# Patient Record
Sex: Female | Born: 1995 | Race: White | Hispanic: No | Marital: Single | Smoking: Former smoker
Health system: Southern US, Community
[De-identification: ages and names within clinical notes are randomized; demographics above are authoritative.]

## PROBLEM LIST (undated history)

## (undated) DIAGNOSIS — F419 Anxiety disorder, unspecified: Secondary | ICD-10-CM

## (undated) DIAGNOSIS — F32A Depression, unspecified: Secondary | ICD-10-CM

---
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
## 2016-04-21 ENCOUNTER — Other Ambulatory Visit: Payer: Self-pay | Admitting: Family Medicine

## 2016-04-21 DIAGNOSIS — R1013 Epigastric pain: Secondary | ICD-10-CM

## 2016-04-22 ENCOUNTER — Ambulatory Visit
Admission: RE | Admit: 2016-04-22 | Discharge: 2016-04-22 | Disposition: A | Discharge status: Home / Self Care | Payer: Managed Care, Other (non HMO) | Source: Ambulatory Visit | Attending: Family Medicine | Admitting: Family Medicine

## 2016-04-22 DIAGNOSIS — R195 Other fecal abnormalities: Secondary | ICD-10-CM | POA: Diagnosis not present

## 2016-04-22 DIAGNOSIS — R1013 Epigastric pain: Secondary | ICD-10-CM | POA: Diagnosis present

## 2016-04-22 MED ORDER — IOPAMIDOL (ISOVUE-300) INJECTION 61%
100.0000 mL | Freq: Once | INTRAVENOUS | Status: AC | PRN
  Administered 2016-04-22: 100 mL via INTRAVENOUS

## 2017-12-03 ENCOUNTER — Ambulatory Visit: Payer: Self-pay | Admitting: Family Medicine

## 2017-12-03 VITALS — BP 100/65 | HR 78 | Temp 97.9°F | Resp 16 | Wt 109.0 lb

## 2017-12-03 DIAGNOSIS — L259 Unspecified contact dermatitis, unspecified cause: Secondary | ICD-10-CM

## 2017-12-03 MED ORDER — TRIAMCINOLONE ACETONIDE 0.1 % EX CREA: 1.0000 "application " | TOPICAL_CREAM | Freq: Two times a day (BID) | CUTANEOUS | 0 refills | Status: AC

## 2017-12-03 MED ORDER — PREDNISONE 20 MG PO TABS: 40.0000 mg | ORAL_TABLET | Freq: Every day | ORAL | 0 refills | Status: AC

## 2017-12-03 NOTE — Progress Notes (Signed)
Heather Adkins is a 22 y.o. female who presents today with concerns of rash that has persisted for the last 4 days. Patient reports recently moving boxes into a new apartment and having those boxes touch  Review of Systems  Constitutional: Negative for chills, fever and malaise/fatigue.  HENT: Negative for congestion, ear discharge, ear pain, sinus pain and sore throat.   Eyes: Negative.   Respiratory: Negative for cough, sputum production and shortness of breath.   Cardiovascular: Negative.  Negative for chest pain.  Gastrointestinal: Negative for abdominal pain, diarrhea, nausea and vomiting.  Genitourinary: Negative for dysuria, frequency, hematuria and urgency.  Musculoskeletal: Negative for myalgias.  Skin: Negative.   Neurological: Negative for headaches.  Endo/Heme/Allergies: Negative.   Psychiatric/Behavioral: Negative.     O: Vitals:   12/03/17 1538  BP: 100/65  Pulse: 78  Resp: 16  Temp: 97.9 F (36.6 C)  SpO2: 99%     Physical Exam  Constitutional: Heather Adkins is oriented to person, place, and time. Vital signs are normal. Heather Adkins appears well-developed and well-nourished. Heather Adkins is active.  Non-toxic appearance. Heather Adkins does not have a sickly appearance.  HENT:  Head: Normocephalic.  Right Ear: Hearing, tympanic membrane, external ear and ear canal normal.  Left Ear: Hearing, tympanic membrane, external ear and ear canal normal.  Nose: Nose normal.  Mouth/Throat: Uvula is midline and oropharynx is clear and moist.  Neck: Normal range of motion. Neck supple.  Cardiovascular: Normal rate, regular rhythm, normal heart sounds and normal pulses.  Pulmonary/Chest: Effort normal and breath sounds normal.  Abdominal: Soft. Bowel sounds are normal.  Musculoskeletal: Normal range of motion.  Lymphadenopathy:       Head (right side): No submental and no submandibular adenopathy present.       Head (left side): No submental and no submandibular adenopathy present.    Heather Adkins has no  cervical adenopathy.  Neurological: Heather Adkins is alert and oriented to person, place, and time.  Skin: Rash noted. Rash is maculopapular. There is erythema.     Psychiatric: Heather Adkins has a normal mood and affect.  Vitals reviewed.    A: 1. Contact dermatitis, unspecified contact dermatitis type, unspecified trigger      P: Discussed exam findings, diagnosis etiology and medication use and indications reviewed with patient. Follow- Up and discharge instructions provided. No emergent/urgent issues found on exam.  Patient verbalized understanding of information provided and agrees with plan of care (POC), all questions answered.  1. Contact dermatitis, unspecified contact dermatitis type, unspecified trigger - predniSONE (DELTASONE) 20 MG tablet; Take 2 tablets (40 mg total) by mouth daily with breakfast for 5 days. - triamcinolone cream (KENALOG) 0.1 %; Apply 1 application topically 2 (two) times daily.

## 2017-12-03 NOTE — Patient Instructions (Signed)
Contact Dermatitis Dermatitis is redness, soreness, and swelling (inflammation) of the skin. Contact dermatitis is a reaction to certain substances that touch the skin. You either touched something that irritated your skin, or you have allergies to something you touched. Follow these instructions at home: Skin Care  Moisturize your skin as needed.  Apply cool compresses to the affected areas.  Try taking a bath with: ? Epsom salts. Follow the instructions on the package. You can get these at a pharmacy or grocery store. ? Baking soda. Pour a small amount into the bath as told by your doctor. ? Colloidal oatmeal. Follow the instructions on the package. You can get this at a pharmacy or grocery store.  Try applying baking soda paste to your skin. Stir water into baking soda until it looks like paste.  Do not scratch your skin.  Bathe less often.  Bathe in lukewarm water. Avoid using hot water. Medicines  Take or apply over-the-counter and prescription medicines only as told by your doctor.  If you were prescribed an antibiotic medicine, take or apply your antibiotic as told by your doctor. Do not stop taking the antibiotic even if your condition starts to get better. General instructions  Keep all follow-up visits as told by your doctor. This is important.  Avoid the substance that caused your reaction. If you do not know what caused it, keep a journal to try to track what caused it. Write down: ? What you eat. ? What cosmetic products you use. ? What you drink. ? What you wear in the affected area. This includes jewelry.  If you were given a bandage (dressing), take care of it as told by your doctor. This includes when to change and remove it. Contact a doctor if:  You do not get better with treatment.  Your condition gets worse.  You have signs of infection such as: ? Swelling. ? Tenderness. ? Redness. ? Soreness. ? Warmth.  You have a fever.  You have new  symptoms. Get help right away if:  You have a very bad headache.  You have neck pain.  Your neck is stiff.  You throw up (vomit).  You feel very sleepy.  You see red streaks coming from the affected area.  Your bone or joint underneath the affected area becomes painful after the skin has healed.  The affected area turns darker.  You have trouble breathing. This information is not intended to replace advice given to you by your health care provider. Make sure you discuss any questions you have with your health care provider. Document Released: 02/06/2009 Document Revised: 09/17/2015 Document Reviewed: 08/27/2014 Elsevier Interactive Patient Education  2018 [employer]  

## 2017-12-05 ENCOUNTER — Telehealth: Payer: Self-pay

## 2017-12-05 NOTE — Telephone Encounter (Signed)
I left a message asking the patient asking to call us back if she has any questions or concerns. 

## 2019-10-22 ENCOUNTER — Encounter: Payer: Self-pay | Admitting: Intensive Care

## 2019-10-22 ENCOUNTER — Other Ambulatory Visit: Payer: Self-pay

## 2019-10-22 ENCOUNTER — Emergency Department: Payer: Managed Care, Other (non HMO)

## 2019-10-22 ENCOUNTER — Emergency Department
Admission: EM | Admit: 2019-10-22 | Discharge: 2019-10-22 | Disposition: A | Discharge status: Home / Self Care | Payer: Managed Care, Other (non HMO) | Attending: Emergency Medicine | Admitting: Emergency Medicine

## 2019-10-22 DIAGNOSIS — R112 Nausea with vomiting, unspecified: Secondary | ICD-10-CM | POA: Diagnosis not present

## 2019-10-22 DIAGNOSIS — R197 Diarrhea, unspecified: Secondary | ICD-10-CM | POA: Insufficient documentation

## 2019-10-22 DIAGNOSIS — Z3202 Encounter for pregnancy test, result negative: Secondary | ICD-10-CM | POA: Diagnosis not present

## 2019-10-22 DIAGNOSIS — R1012 Left upper quadrant pain: Secondary | ICD-10-CM | POA: Diagnosis present

## 2019-10-22 DIAGNOSIS — F17210 Nicotine dependence, cigarettes, uncomplicated: Secondary | ICD-10-CM | POA: Diagnosis not present

## 2019-10-22 DIAGNOSIS — E860 Dehydration: Secondary | ICD-10-CM

## 2019-10-22 HISTORY — DX: Depression, unspecified: F32.A

## 2019-10-22 HISTORY — DX: Anxiety disorder, unspecified: F41.9

## 2019-10-22 LAB — URINALYSIS, COMPLETE (UACMP) WITH MICROSCOPIC
Bacteria, UA: NONE SEEN
Bilirubin Urine: NEGATIVE
Glucose, UA: NEGATIVE mg/dL
Ketones, ur: 80 mg/dL — AB
Leukocytes,Ua: NEGATIVE
Nitrite: NEGATIVE
Protein, ur: >=300 mg/dL — AB
RBC / HPF: >50 RBC/hpf — ABNORMAL HIGH (ref 0–5)
Specific Gravity, Urine: 1.024 (ref 1.005–1.030)
pH: 9 — ABNORMAL HIGH (ref 5.0–8.0)

## 2019-10-22 LAB — COMPREHENSIVE METABOLIC PANEL
ALT: 28 U/L (ref 0–44)
AST: 37 U/L (ref 15–41)
Albumin: 4.8 g/dL (ref 3.5–5.0)
Alkaline Phosphatase: 106 U/L (ref 38–126)
Anion gap: 17 — ABNORMAL HIGH (ref 5–15)
BUN: 12 mg/dL (ref 6–20)
CO2: 18 mmol/L — ABNORMAL LOW (ref 22–32)
Calcium: 9.6 mg/dL (ref 8.9–10.3)
Chloride: 104 mmol/L (ref 98–111)
Creatinine, Ser: 0.77 mg/dL (ref 0.44–1.00)
GFR calc Af Amer: >60 mL/min (ref >60)
GFR calc non Af Amer: >60 mL/min (ref >60)
Glucose, Bld: 123 mg/dL — ABNORMAL HIGH (ref 70–99)
Potassium: 3.1 mmol/L — ABNORMAL LOW (ref 3.5–5.1)
Sodium: 139 mmol/L (ref 135–145)
Total Bilirubin: 1.8 mg/dL — ABNORMAL HIGH (ref 0.3–1.2)
Total Protein: 7.9 g/dL (ref 6.5–8.1)

## 2019-10-22 LAB — CBC
HCT: 40.4 % (ref 36.0–46.0)
Hemoglobin: 14.5 g/dL (ref 12.0–15.0)
MCH: 34.4 pg — ABNORMAL HIGH (ref 26.0–34.0)
MCHC: 35.9 g/dL (ref 30.0–36.0)
MCV: 95.7 fL (ref 80.0–100.0)
Platelets: 315 10*3/uL (ref 150–400)
RBC: 4.22 MIL/uL (ref 3.87–5.11)
RDW: 11.6 % (ref 11.5–15.5)
WBC: 17.7 10*3/uL — ABNORMAL HIGH (ref 4.0–10.5)
nRBC: 0 % (ref 0.0–0.2)

## 2019-10-22 LAB — GLUCOSE, CAPILLARY: Glucose-Capillary: 119 mg/dL — ABNORMAL HIGH (ref 70–99)

## 2019-10-22 LAB — POCT PREGNANCY, URINE: Preg Test, Ur: NEGATIVE

## 2019-10-22 LAB — LIPASE, BLOOD: Lipase: 20 U/L (ref 11–51)

## 2019-10-22 MED ORDER — ONDANSETRON HCL 4 MG/2ML IJ SOLN
4.0000 mg | Freq: Once | INTRAMUSCULAR | Status: AC | PRN
  Administered 2019-10-22: 4 mg via INTRAVENOUS
  Filled 2019-10-22: qty 2

## 2019-10-22 MED ORDER — SODIUM CHLORIDE 0.9 % IV BOLUS
1000.0000 mL | Freq: Once | INTRAVENOUS | Status: AC
  Administered 2019-10-22: 1000 mL via INTRAVENOUS

## 2019-10-22 MED ORDER — LIDOCAINE VISCOUS HCL 2 % MT SOLN
15.0000 mL | Freq: Once | OROMUCOSAL | Status: AC
  Administered 2019-10-22: 15 mL via ORAL
  Filled 2019-10-22: qty 15

## 2019-10-22 MED ORDER — PROMETHAZINE HCL 25 MG RE SUPP: 25.0000 mg | Freq: Three times a day (TID) | RECTAL | 0 refills | Status: AC | PRN

## 2019-10-22 MED ORDER — ONDANSETRON 4 MG PO TBDP
4.0000 mg | ORAL_TABLET | Freq: Three times a day (TID) | ORAL | 0 refills | Status: AC | PRN
Start: 2019-10-22 — End: ?

## 2019-10-22 MED ORDER — MORPHINE SULFATE (PF) 4 MG/ML IV SOLN
4.0000 mg | Freq: Once | INTRAVENOUS | Status: AC
  Administered 2019-10-22: 4 mg via INTRAVENOUS
  Filled 2019-10-22: qty 1

## 2019-10-22 MED ORDER — FENTANYL CITRATE (PF) 100 MCG/2ML IJ SOLN
50.0000 ug | INTRAMUSCULAR | Status: DC | PRN
  Administered 2019-10-22: 50 ug via INTRAVENOUS
  Filled 2019-10-22: qty 2

## 2019-10-22 MED ORDER — ALUM & MAG HYDROXIDE-SIMETH 200-200-20 MG/5ML PO SUSP
30.0000 mL | Freq: Once | ORAL | Status: AC
  Administered 2019-10-22: 30 mL via ORAL
  Filled 2019-10-22: qty 30

## 2019-10-22 MED ORDER — FAMOTIDINE 20 MG PO TABS
40.0000 mg | ORAL_TABLET | Freq: Every day | ORAL | 0 refills | Status: AC
Start: 2019-10-22 — End: 2019-10-27

## 2019-10-22 MED ORDER — ONDANSETRON HCL 4 MG/2ML IJ SOLN
INTRAMUSCULAR | Status: AC
  Filled 2019-10-22: qty 2

## 2019-10-22 MED ORDER — DICYCLOMINE HCL 10 MG PO CAPS
20.0000 mg | ORAL_CAPSULE | Freq: Once | ORAL | Status: AC
  Administered 2019-10-22: 20 mg via ORAL
  Filled 2019-10-22: qty 2

## 2019-10-22 MED ORDER — ONDANSETRON HCL 4 MG/2ML IJ SOLN
4.0000 mg | Freq: Once | INTRAMUSCULAR | Status: AC | PRN
  Administered 2019-10-22: 4 mg via INTRAVENOUS

## 2019-10-22 MED ORDER — IOHEXOL 300 MG/ML  SOLN
75.0000 mL | Freq: Once | INTRAMUSCULAR | Status: AC | PRN
  Administered 2019-10-22: 75 mL via INTRAVENOUS
  Filled 2019-10-22: qty 75

## 2019-10-22 MED ORDER — KETOROLAC TROMETHAMINE 30 MG/ML IJ SOLN
15.0000 mg | Freq: Once | INTRAMUSCULAR | Status: AC
  Administered 2019-10-22: 15 mg via INTRAVENOUS
  Filled 2019-10-22: qty 1

## 2019-10-22 NOTE — ED Notes (Signed)
Patient transported to CT 

## 2019-10-22 NOTE — ED Triage Notes (Signed)
Pt c/o LLQ pain with N/V/D since yesterday. Denies fevers. Reports drinking alcohol last night. Patient breathing heavily in triage.

## 2019-10-22 NOTE — ED Provider Notes (Signed)
[facility] Emergency Department Provider Note  ____________________________________________   First MD Initiated Contact with Patient 10/22/19 1500     (approximate)  I have reviewed the triage vital signs and the nursing notes.   HISTORY  Chief Complaint Abdominal Pain    HPI Heather Adkins is a 24 y.o. female  With h/o depression, anxiety, here with left sided abdominal pain. Pt reports that her sx started yesterday. She had gone out the night before and drank a decent amount of tequila, which is new for her. The next AM, she began vomiting and vomited for the next day. She has been essentially unable to eat/drink much since then. Her vomiting improved yesterday but she has since developed severe, aching, gnawing, left-sided abd pain along with vomiting and difficulty PO. No fever, chills. No urinary symptoms. Denies vaginal bleeding, discharge. No known h/o gallstones, pancreatitis, or gastritis. No heavy regular heavy EtOH use. No blood in her emesis or diarrhea or stool.        Past Medical History:  Diagnosis Date  . Anxiety   . Depression     There are no problems to display for this patient.   History reviewed. No pertinent surgical history.  Prior to Admission medications   Medication Sig Start Date End Date Taking? Authorizing Provider  cholecalciferol (VITAMIN D) 1000 units tablet Take 1,000 Units by mouth daily.    [provider]  famotidine (PEPCID) 20 MG tablet Take 2 tablets (40 mg total) by mouth daily for 5 days. 10/22/19 10/27/19  Shaune Pollack, MD  HYDROCORTISONE-DIPHENHYDRAMINE EX Apply topically.    [provider]  medroxyPROGESTERone (DEPO-PROVERA) 150 MG/ML injection Inject into the muscle. 12/23/16   [provider]  medroxyPROGESTERone Acetate 150 MG/ML SUSY  09/12/17   [provider]  Multiple Vitamin (MULTIVITAMIN) tablet Take 1 tablet by mouth daily.    [provider]    ondansetron (ZOFRAN ODT) 4 MG disintegrating tablet Take 1 tablet (4 mg total) by mouth every 8 (eight) hours as needed for nausea or vomiting. 10/22/19   Shaune Pollack, MD  promethazine (PHENERGAN) 25 MG suppository Place 1 suppository (25 mg total) rectally every 8 (eight) hours as needed for refractory nausea / vomiting. 10/22/19 10/21/20  Shaune Pollack, MD  triamcinolone cream (KENALOG) 0.1 % Apply 1 application topically 2 (two) times daily. 12/03/17   Zachery Dauer, NP  venlafaxine XR (EFFEXOR-XR) 150 MG 24 hr capsule Take by mouth. 12/22/16   [provider]    Allergies Patient has no known allergies.  History reviewed. No pertinent family history.  Social History Social History   Tobacco Use  . Smoking status: Current Every Day Smoker    Types: E-cigarettes  . Smokeless tobacco: Never Used  Substance Use Topics  . Alcohol use: Yes  . Drug use: Yes    Types: Marijuana    Review of Systems  Review of Systems  Constitutional: Positive for fatigue. Negative for fever.  HENT: Negative for congestion and sore throat.   Eyes: Negative for visual disturbance.  Respiratory: Negative for cough and shortness of breath.   Cardiovascular: Negative for chest pain.  Gastrointestinal: Positive for abdominal pain, nausea and vomiting. Negative for diarrhea.  Genitourinary: Negative for flank pain.  Musculoskeletal: Negative for back pain and neck pain.  Skin: Negative for rash and wound.  Neurological: Negative for weakness.  All other systems reviewed and are negative.    ____________________________________________  PHYSICAL EXAM:  VITAL SIGNS: ED Triage Vitals  Enc Vitals Group     BP 10/22/19 1337 (!) 137/91     Pulse Rate 10/22/19 1337 (!) 102     Resp 10/22/19 1337 (!) 28     Temp 10/22/19 1337 98.3 F (36.8 C)     Temp Source 10/22/19 1337 Oral     SpO2 10/22/19 1337 100 %     Weight 10/22/19 1342 118 lb (53.5 kg)     Height 10/22/19 1342 5\' 5"   (1.651 m)     Head Circumference --      Peak Flow --      Pain Score 10/22/19 1342 10     Pain Loc --      Pain Edu? --      Excl. in GC? --      Physical Exam Vitals and nursing note reviewed.  Constitutional:      General: She is not in acute distress.    Appearance: She is well-developed.  HENT:     Head: Normocephalic and atraumatic.  Eyes:     Conjunctiva/sclera: Conjunctivae normal.  Cardiovascular:     Rate and Rhythm: Normal rate and regular rhythm.     Heart sounds: Normal heart sounds. No murmur heard.  No friction rub.  Pulmonary:     Effort: Pulmonary effort is normal. No respiratory distress.     Breath sounds: Normal breath sounds. No wheezing or rales.  Abdominal:     General: There is no distension.     Palpations: Abdomen is soft.     Tenderness: There is no abdominal tenderness.  Musculoskeletal:     Cervical back: Neck supple.  Skin:    General: Skin is warm.     Capillary Refill: Capillary refill takes less than 2 seconds.  Neurological:     Mental Status: She is alert and oriented to person, place, and time.     Motor: No abnormal muscle tone.       ____________________________________________   LABS (all labs ordered are listed, but only abnormal results are displayed)  Labs Reviewed  GLUCOSE, CAPILLARY - Abnormal; Notable for the following components:      Result Value   Glucose-Capillary 119 (*)    All other components within normal limits  COMPREHENSIVE METABOLIC PANEL - Abnormal; Notable for the following components:   Potassium 3.1 (*)    CO2 18 (*)    Glucose, Bld 123 (*)    Total Bilirubin 1.8 (*)    Anion gap 17 (*)    All other components within normal limits  CBC - Abnormal; Notable for the following components:   WBC 17.7 (*)    MCH 34.4 (*)    All other components within normal limits  URINALYSIS, COMPLETE (UACMP) WITH MICROSCOPIC - Abnormal; Notable for the following components:   Color, Urine YELLOW (*)     APPearance HAZY (*)    pH 9.0 (*)    Hgb urine dipstick SMALL (*)    Ketones, ur 80 (*)    Protein, ur >=300 (*)    RBC / HPF >50 (*)    All other components within normal limits  LIPASE, BLOOD  POC URINE PREG, ED  POCT PREGNANCY, URINE    ____________________________________________  EKG: None ________________________________________  RADIOLOGY All imaging, including plain films, CT scans, and ultrasounds, independently reviewed by me, and interpretations confirmed via formal radiology reads.  ED MD interpretation:   CT: Fluid throughout small bowel, no acute abnormality, normal appendix,  no hydro  Official radiology report(s): CT ABDOMEN PELVIS W CONTRAST  Result Date: 10/22/2019 CLINICAL DATA:  Abdominal pain with nausea vomiting EXAM: CT ABDOMEN AND PELVIS WITH CONTRAST TECHNIQUE: Multidetector CT imaging of the abdomen and pelvis was performed using the standard protocol following bolus administration of intravenous contrast. CONTRAST:  60mL OMNIPAQUE IOHEXOL 300 MG/ML  SOLN COMPARISON:  April 22, 2016 FINDINGS: Lower chest: Lung bases are clear. Hepatobiliary: No focal liver lesions are appreciable. Gallbladder wall is not appreciably thickened. There is no biliary duct dilatation. Pancreas: There is no pancreatic mass or inflammatory focus. Spleen: No splenic lesions are evident. Adrenals/Urinary Tract: Adrenals bilaterally appear unremarkable. There is no appreciable renal mass or hydronephrosis on either side. No intrarenal calculi are evident. Contrast present with in the collecting systems could mask small intrarenal calculi. No ureteral calculi are evident on either side. Urinary bladder is midline with wall thickness within normal limits. Stomach/Bowel: Most small bowel loops are fluid filled. There is no appreciable bowel wall or mesenteric thickening. No evident bowel obstruction. The terminal ileum appears normal. There is no appreciable free air or portal venous air. No  appreciable diverticular disease. Vascular/Lymphatic: No abdominal aortic aneurysm. No arterial vascular lesions evident. Major venous structures appear patent. There is no evident adenopathy in the abdomen or pelvis. Reproductive: Uterus is anteverted. There is an apparent dominant follicle in the left ovary measuring 2.1 x 1.9 cm. Beyond apparent dominant follicle, no pelvic mass lesions are evident. Other: Appendix appears unremarkable. No abscess or ascites is evident in the abdomen or pelvis. Musculoskeletal: No blastic or lytic bone lesions. There is no intramuscular or abdominal wall lesion evident. IMPRESSION: 1. Most small bowel loops are fluid filled. While this finding may be seen normally, it raises question of a degree of enteritis or early ileus. Bowel obstruction not present. No bowel wall thickening. 2. No abscess in the abdomen pelvis. Appendix region appears normal. 3. No hydronephrosis. No ureteral calculus evident. No renal calculus evident with contrast noted in portions of the collecting systems which potentially could mask small calculi within the kidneys. Electronically Signed   By: Bretta Bang III M.D.   On: 10/22/2019 16:33    ____________________________________________  PROCEDURES   Procedure(s) performed (including Critical Care):  Procedures  ____________________________________________  INITIAL IMPRESSION / MDM / ASSESSMENT AND PLAN / ED COURSE  As part of my medical decision making, I reviewed the following data within the electronic MEDICAL RECORD NUMBER Nursing notes reviewed and incorporated, Old chart reviewed, Notes from prior ED visits, and [place] Controlled Substance Database       *Shermika Balthaser was evaluated in Emergency Department on 10/22/2019 for the symptoms described in the history of present illness. She was evaluated in the context of the global COVID-19 pandemic, which necessitated consideration that the patient might be at risk for infection  with the SARS-CoV-2 virus that causes COVID-19. Institutional protocols and algorithms that pertain to the evaluation of patients at risk for COVID-19 are in a state of rapid change based on information released by regulatory bodies including the CDC and federal and state organizations. These policies and algorithms were followed during the patient's care in the ED.  Some ED evaluations and interventions may be delayed as a result of limited staffing during the pandemic.*     Medical Decision Making:  24 yo F here with abd pain, n/v in setting of heavy EtOH use. Likely alcohol gastritis, less likely viral GI illness. Pt appears dehydrated on arrival  and labs show decreased bicarb, mild hypoK, ketonuria c/w dehydration. She has mild pyuria but no bacteria, no urinary sx, doubt UTI. She is on her period hence bleeding. WBC 17.7k likely reactive, and AG 17 likely related to her dehydration. Glucose is 123, CO2 18. No glucosuria.  Following fluids, analgesia, pt feels much better. She is tolerating PO. Will d/c with supportive care, encouraged fluids, and close f/u. Return precautions given.  ____________________________________________  FINAL CLINICAL IMPRESSION(S) / ED DIAGNOSES  Final diagnoses:  Dehydration  Nausea vomiting and diarrhea     MEDICATIONS GIVEN DURING THIS VISIT:  Medications  ondansetron (ZOFRAN) injection 4 mg (4 mg Intravenous Given 10/22/19 1348)  sodium chloride 0.9 % bolus 1,000 mL (0 mLs Intravenous Stopped 10/22/19 1715)  sodium chloride 0.9 % bolus 1,000 mL (0 mLs Intravenous Stopped 10/22/19 1715)  ketorolac (TORADOL) 30 MG/ML injection 15 mg (15 mg Intravenous Given 10/22/19 1548)  morphine 4 MG/ML injection 4 mg (4 mg Intravenous Given 10/22/19 1547)  ondansetron (ZOFRAN) injection 4 mg (4 mg Intravenous Given 10/22/19 1554)  iohexol (OMNIPAQUE) 300 MG/ML solution 75 mL (75 mLs Intravenous Contrast Given 10/22/19 1612)  dicyclomine (BENTYL) capsule 20 mg (20 mg Oral  Given 10/22/19 1647)  alum & mag hydroxide-simeth (MAALOX/MYLANTA) 200-200-20 MG/5ML suspension 30 mL (30 mLs Oral Given 10/22/19 1647)    And  lidocaine (XYLOCAINE) 2 % viscous mouth solution 15 mL (15 mLs Oral Given 10/22/19 1647)     ED Discharge Orders         Ordered    ondansetron (ZOFRAN ODT) 4 MG disintegrating tablet  Every 8 hours PRN     Discontinue  Reprint     10/22/19 1731    promethazine (PHENERGAN) 25 MG suppository  Every 8 hours PRN     Discontinue  Reprint     10/22/19 1731    famotidine (PEPCID) 20 MG tablet  Daily     Discontinue  Reprint     10/22/19 1731           Note:  This document was prepared using Dragon voice recognition software and may include unintentional dictation errors.   Shaune PollackIsaacs, Nadyne Gariepy, MD 10/22/19 1758

## 2021-01-01 IMAGING — CT CT ABD-PELV W/ CM
2 of 4 series · 15 of 46 positions shown, 17 images · IV contrast (APPLIED)
Comparison: April 22, 2016

CLINICAL DATA: Abdominal pain with nausea vomiting

EXAM:
CT ABDOMEN AND PELVIS WITH CONTRAST
TECHNIQUE: Multidetector CT imaging of the abdomen and pelvis was performed
using the standard protocol following bolus administration of
intravenous contrast.
CONTRAST:  75mL OMNIPAQUE IOHEXOL 300 MG/ML  SOLN

[Series 2: axial st · axial · 0.65mm/px · z∈[-538,-143]mm · 12 of 91 slices shown, 14 images]
[im 8/91  soft-tissue]
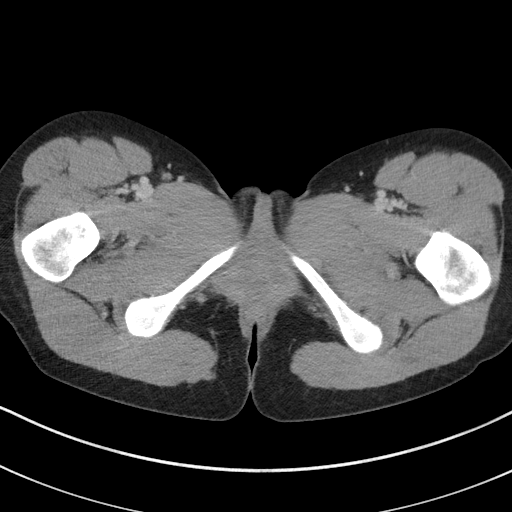
[im 8/91  bone]
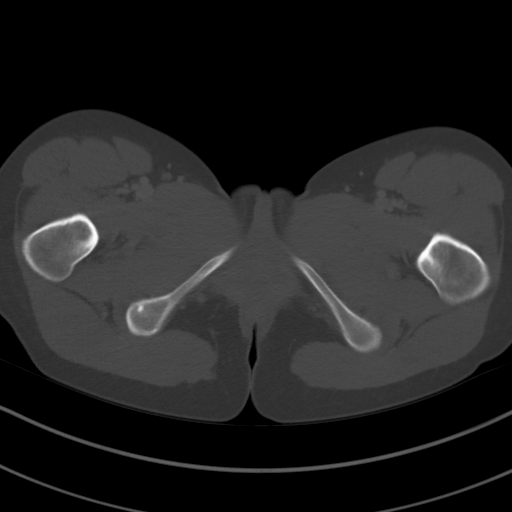
[im 15/91  soft-tissue]
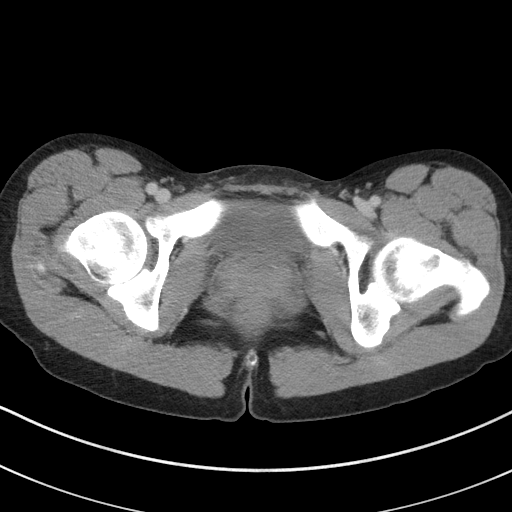
[im 22/91  soft-tissue]
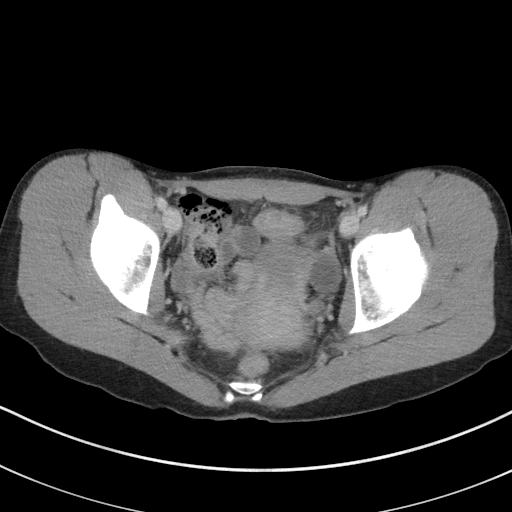
[im 29/91  soft-tissue]
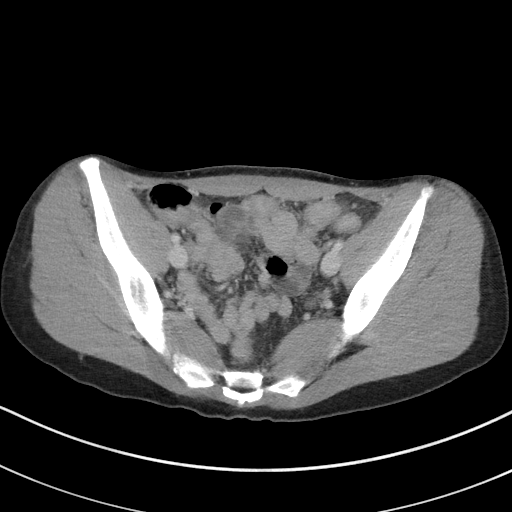
[im 37/91  soft-tissue]
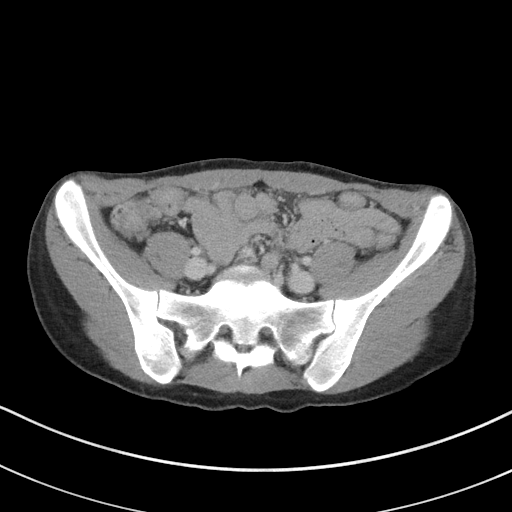
[im 44/91  soft-tissue]
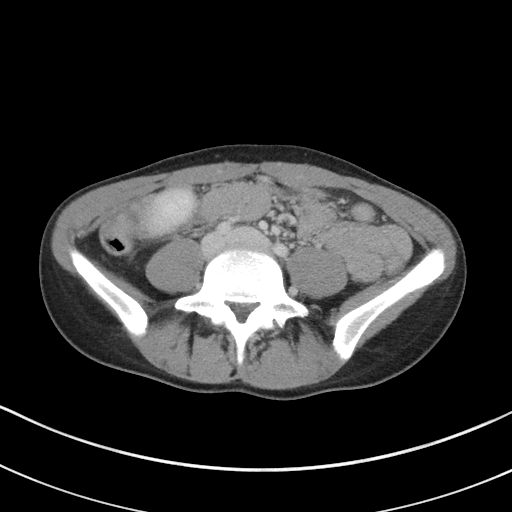
[im 51/91  soft-tissue]
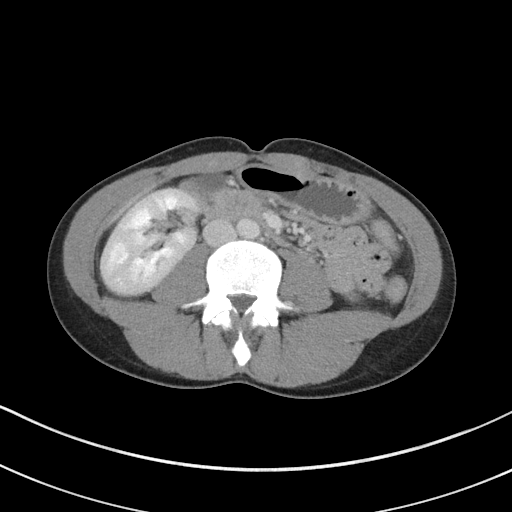
[im 58/91  soft-tissue]
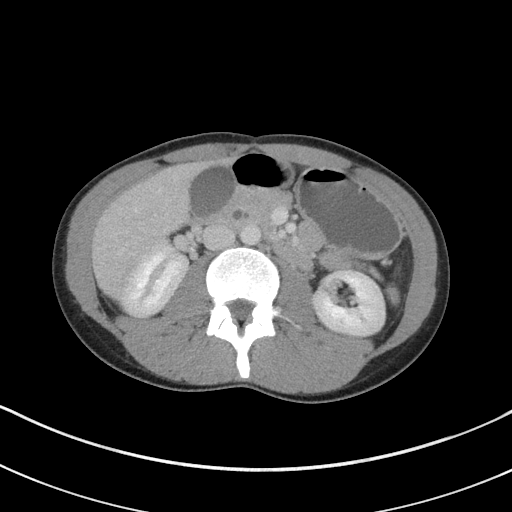
[im 65/91  soft-tissue]
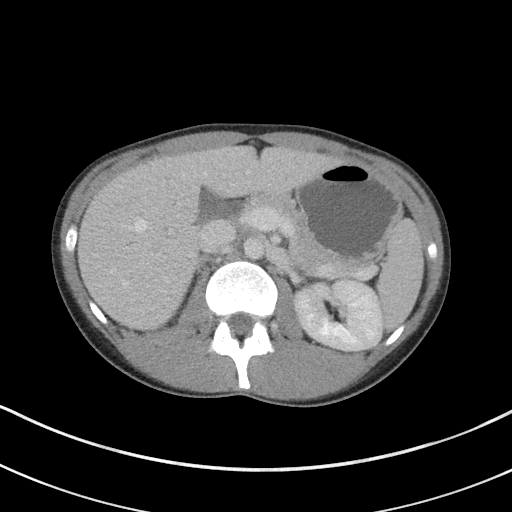
[im 65/91  bone]
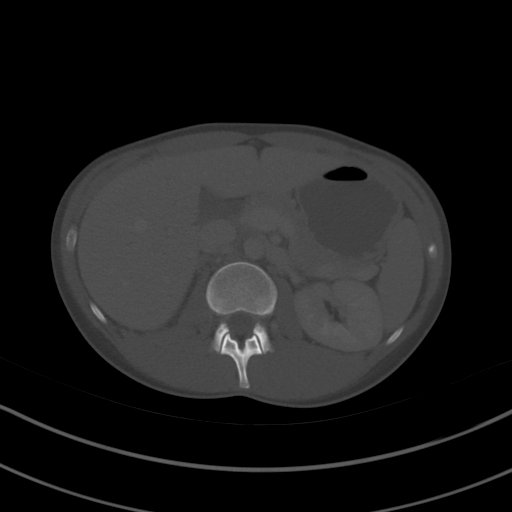
[im 73/91  soft-tissue]
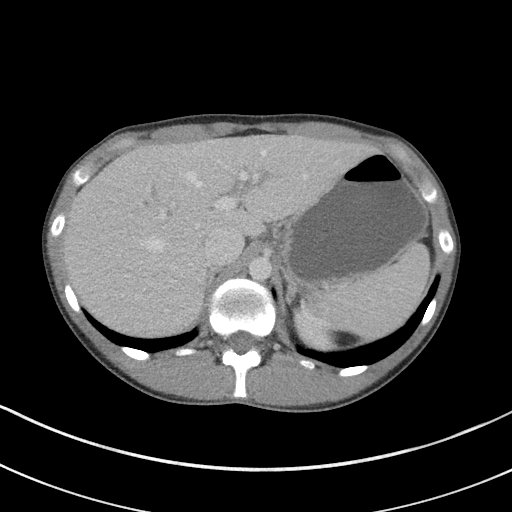
[im 80/91  soft-tissue]
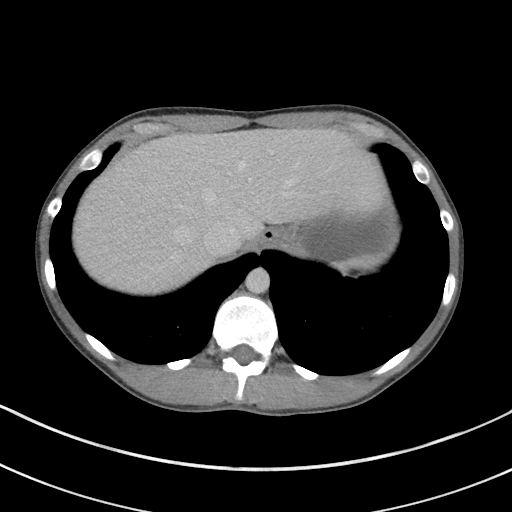
[im 87/91  soft-tissue]
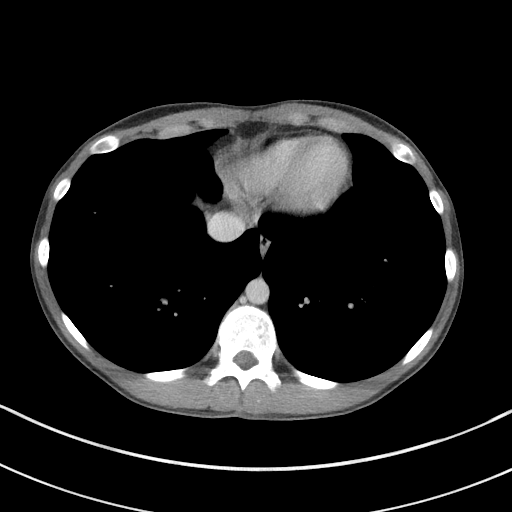

[Series 5: coronal st · coronal · 0.63mm/px · 3 of 66 slices shown]
[im 22/66  soft-tissue]
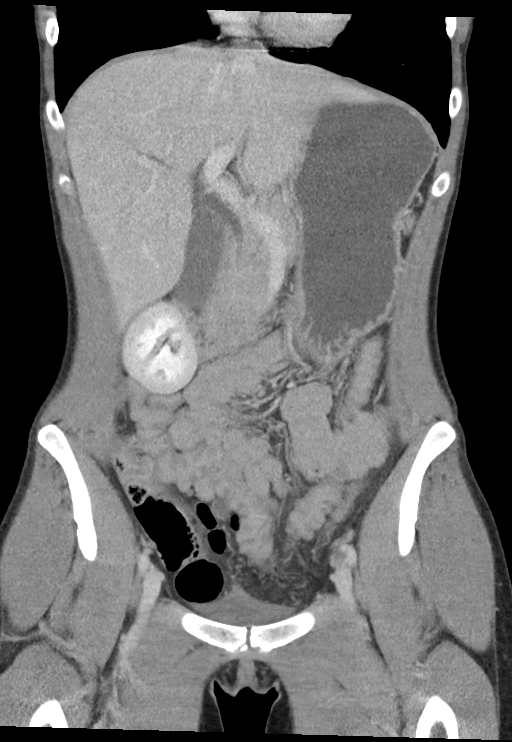
[im 29/66  soft-tissue]
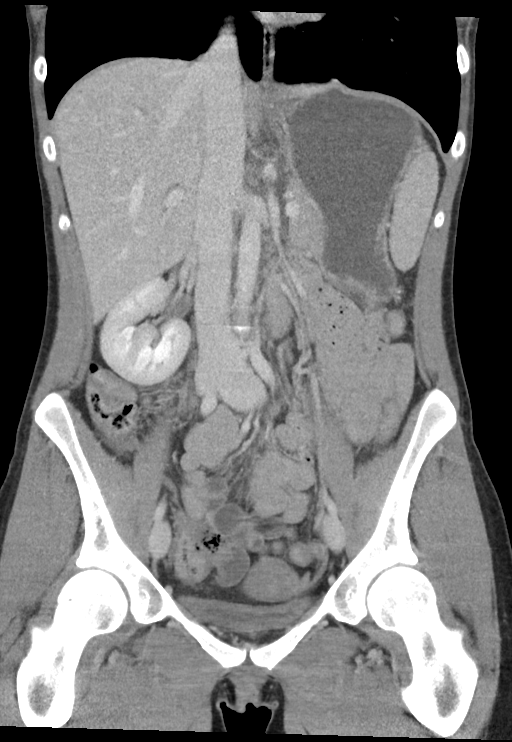
[im 37/66  soft-tissue]
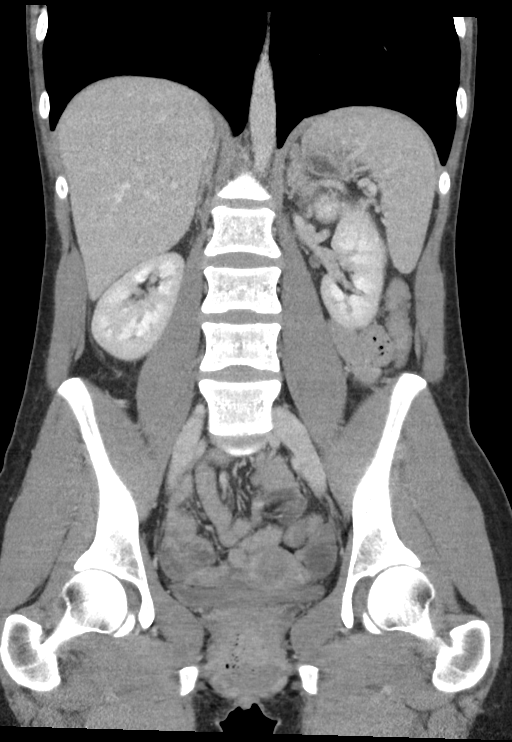

[15 of 46 positions shown; findings below may reference images not displayed]

FINDINGS: Lower chest: Lung bases are clear.

Hepatobiliary: No focal liver lesions are appreciable. Gallbladder
wall is not appreciably thickened. There is no biliary duct
dilatation.

Pancreas: There is no pancreatic mass or inflammatory focus.

Spleen: No splenic lesions are evident.

Adrenals/Urinary Tract: Adrenals bilaterally appear unremarkable.
There is no appreciable renal mass or hydronephrosis on either side.
No intrarenal calculi are evident. Contrast present with in the
collecting systems could mask small intrarenal calculi. No ureteral
calculi are evident on either side. Urinary bladder is midline with
wall thickness within normal limits.

Stomach/Bowel: Most small bowel loops are fluid filled. There is no
appreciable bowel wall or mesenteric thickening. No evident bowel
obstruction. The terminal ileum appears normal. There is no
appreciable free air or portal venous air. No appreciable
diverticular disease.

Vascular/Lymphatic: No abdominal aortic aneurysm. No arterial
vascular lesions evident. Major venous structures appear patent.
There is no evident adenopathy in the abdomen or pelvis.

Reproductive: Uterus is anteverted. There is an apparent dominant
follicle in the left ovary measuring 2.1 x 1.9 cm. Beyond apparent
dominant follicle, no pelvic mass lesions are evident.

Other: Appendix appears unremarkable. No abscess or ascites is
evident in the abdomen or pelvis.

Musculoskeletal: No blastic or lytic bone lesions. There is no
intramuscular or abdominal wall lesion evident.
IMPRESSION: 1. Most small bowel loops are fluid filled. While this finding may
be seen normally, it raises question of a degree of enteritis or
early ileus. Bowel obstruction not present. No bowel wall
thickening.

2. No abscess in the abdomen pelvis. Appendix region appears normal.

3. No hydronephrosis. No ureteral calculus evident. No renal
calculus evident with contrast noted in portions of the collecting
systems which potentially could mask small calculi within the
kidneys.

## 2021-03-04 ENCOUNTER — Encounter: Payer: Self-pay | Admitting: Emergency Medicine

## 2021-03-04 ENCOUNTER — Other Ambulatory Visit: Payer: Self-pay

## 2021-03-04 ENCOUNTER — Ambulatory Visit
Admission: EM | Admit: 2021-03-04 | Discharge: 2021-03-04 | Disposition: A | Discharge status: Home / Self Care | Payer: Managed Care, Other (non HMO) | Attending: Emergency Medicine | Admitting: Emergency Medicine

## 2021-03-04 DIAGNOSIS — B3731 Acute candidiasis of vulva and vagina: Secondary | ICD-10-CM | POA: Insufficient documentation

## 2021-03-04 LAB — WET PREP, GENITAL
Clue Cells Wet Prep HPF POC: NONE SEEN
Sperm: NONE SEEN
Trich, Wet Prep: NONE SEEN

## 2021-03-04 LAB — URINALYSIS, COMPLETE (UACMP) WITH MICROSCOPIC
Bilirubin Urine: NEGATIVE
Glucose, UA: NEGATIVE mg/dL
Ketones, ur: NEGATIVE mg/dL
Leukocytes,Ua: NEGATIVE
Nitrite: NEGATIVE
Protein, ur: NEGATIVE mg/dL
Specific Gravity, Urine: 1.015 (ref 1.005–1.030)
pH: 7 (ref 5.0–8.0)

## 2021-03-04 MED ORDER — FLUCONAZOLE 200 MG PO TABS: 200.0000 mg | ORAL_TABLET | Freq: Every day | ORAL | 1 refills | Status: AC

## 2021-03-04 NOTE — Discharge Instructions (Addendum)
Take the Diflucan tablet now and repeat in 1 week if you are still having symptoms.  We will send her urine for culture and if any bacteria grows out we will treat you at that time but I suspect her urinary symptoms are coming from the yeast infection.  Return for reevaluation for any new or worsening symptoms.

## 2021-03-04 NOTE — ED Triage Notes (Signed)
Pt presents today with painful urination x 2 days, denies vaginal discharge. She took AZO yesterday.

## 2021-03-04 NOTE — ED Provider Notes (Signed)
[facility]    CSN: [id number] Arrival date & time: 03/04/21  1020      History   Chief Complaint Chief Complaint  Patient presents with   Dysuria    HPI Heather Adkins is a 25 y.o. female.   HPI  25 year old female here for evaluation of painful urination.  Patient ports that she had painful urination for the last 2 days and is also had an increase in the quantity of her vaginal discharge and a change of the color to white.  She states that she does not have any vaginal itching or odor.  Patient denies any low back pain, abdominal pain, fever, or blood in her urine.  Past Medical History:  Diagnosis Date   Anxiety    Depression     There are no problems to display for this patient.   History reviewed. No pertinent surgical history.  OB History   No obstetric history on file.      Home Medications    Prior to Admission medications   Medication Sig Start Date End Date Taking? Authorizing Provider  fluconazole (DIFLUCAN) 200 MG tablet Take 1 tablet (200 mg total) by mouth daily for 2 doses. 03/04/21 03/06/21 Yes Margarette Canada, NP  cholecalciferol (VITAMIN D) 1000 units tablet Take 1,000 Units by mouth daily.    [provider]  famotidine (PEPCID) 20 MG tablet Take 2 tablets (40 mg total) by mouth daily for 5 days. 10/22/19 10/27/19  Duffy Bruce, MD  HYDROCORTISONE-DIPHENHYDRAMINE EX Apply topically.    [provider]  medroxyPROGESTERone (DEPO-PROVERA) 150 MG/ML injection Inject into the muscle. 12/23/16   [provider]  medroxyPROGESTERone Acetate 150 MG/ML SUSY  09/12/17   [provider]  Multiple Vitamin (MULTIVITAMIN) tablet Take 1 tablet by mouth daily.    [provider]  ondansetron (ZOFRAN ODT) 4 MG disintegrating tablet Take 1 tablet (4 mg total) by mouth every 8 (eight) hours as needed for nausea or vomiting. 10/22/19   Duffy Bruce, MD  promethazine (PHENERGAN) 25 MG suppository Place  1 suppository (25 mg total) rectally every 8 (eight) hours as needed for refractory nausea / vomiting. 10/22/19 10/21/20  Duffy Bruce, MD  triamcinolone cream (KENALOG) 0.1 % Apply 1 application topically 2 (two) times daily. 12/03/17   Shella Maxim, NP  venlafaxine XR (EFFEXOR-XR) 150 MG 24 hr capsule Take by mouth. 12/22/16   [provider]    Family History History reviewed. No pertinent family history.  Social History Social History   Tobacco Use   Smoking status: Every Day    Types: E-cigarettes   Smokeless tobacco: Never  Substance Use Topics   Alcohol use: Yes   Drug use: Yes    Types: Marijuana     Allergies   Patient has no known allergies.   Review of Systems Review of Systems  Constitutional:  Negative for activity change, appetite change and fever.  Gastrointestinal:  Negative for abdominal pain, nausea and vomiting.  Genitourinary:  Positive for dysuria, frequency, urgency and vaginal discharge. Negative for hematuria, vaginal bleeding and vaginal pain.  Musculoskeletal:  Negative for back pain.  Skin:  Negative for rash.  Hematological: Negative.   Psychiatric/Behavioral: Negative.      Physical Exam Triage Vital Signs ED Triage Vitals  Enc Vitals Group     BP 03/04/21 1052 128/83     Pulse Rate 03/04/21 1052 90     Resp 03/04/21 1052 16     Temp 03/04/21 1052 98.6  F (37 C)     Temp Source 03/04/21 1052 Oral     SpO2 03/04/21 1052 100 %     Weight --      Height --      Head Circumference --      Peak Flow --      Pain Score 03/04/21 1050 1     Pain Loc --      Pain Edu? --      Excl. in GC? --    No data found.  Updated Vital Signs BP 128/83 (BP Location: Left Arm)   Pulse 90   Temp 98.6 F (37 C) (Oral)   Resp 16   SpO2 100%   Visual Acuity Right Eye Distance:   Left Eye Distance:   Bilateral Distance:    Right Eye Near:   Left Eye Near:    Bilateral Near:     Physical Exam Vitals and nursing note reviewed.   Constitutional:      General: She is not in acute distress.    Appearance: Normal appearance. She is not ill-appearing.  HENT:     Head: Normocephalic and atraumatic.  Cardiovascular:     Rate and Rhythm: Normal rate and regular rhythm.     Pulses: Normal pulses.     Heart sounds: Normal heart sounds. No murmur heard.   No gallop.  Pulmonary:     Effort: Pulmonary effort is normal.     Breath sounds: Normal breath sounds. No wheezing, rhonchi or rales.  Abdominal:     General: Abdomen is flat. Bowel sounds are normal.     Palpations: Abdomen is soft.     Tenderness: There is abdominal tenderness. There is no right CVA tenderness, left CVA tenderness, guarding or rebound.  Skin:    General: Skin is warm and dry.     Capillary Refill: Capillary refill takes less than 2 seconds.     Findings: No erythema or rash.  Neurological:     General: No focal deficit present.     Mental Status: She is alert and oriented to person, place, and time.  Psychiatric:        Mood and Affect: Mood normal.        Behavior: Behavior normal.        Thought Content: Thought content normal.        Judgment: Judgment normal.     UC Treatments / Results  Labs (all labs ordered are listed, but only abnormal results are displayed) Labs Reviewed  WET PREP, GENITAL - Abnormal; Notable for the following components:      Result Value   Yeast Wet Prep HPF POC PRESENT (*)    WBC, Wet Prep HPF POC FEW (*)    All other components within normal limits  URINALYSIS, COMPLETE (UACMP) WITH MICROSCOPIC - Abnormal; Notable for the following components:   Hgb urine dipstick TRACE (*)    Non Squamous Epithelial PRESENT (*)    Bacteria, UA FEW (*)    All other components within normal limits  URINE CULTURE    EKG   Radiology No results found.  Procedures Procedures (including critical care time)  Medications Ordered in UC Medications - No data to display  Initial Impression / Assessment and Plan / UC  Course  I have reviewed the triage vital signs and the nursing notes.  Pertinent labs & imaging results that were available during my care of the patient were reviewed by me and considered in my medical  decision making (see chart for details).  Patient is a nontoxic-appearing 25 year old female here for evaluation of urinary complaints as outlined in the HPI above.  Patient's physical exam reveals a benign cardiopulmonary exam with clear lung sounds in all fields.  No CVA tenderness on exam.  Abdomen is flat, soft, with mild suprapubic tenderness but is free of tenderness everywhere else.  No guarding or rebound.  Urinalysis was collected at triage.  Urinalysis shows trace hemoglobin, non-squamous epithelials are present, 6-10 squamous epithelials present, 6-10 WBCs, and few bacteria.  No leukocyte esterase, nitrates, protein present in the urine.  We will send urine for culture but suspect this is a contaminated specimen.  Will order wet prep to look for the presence of bacterial vaginosis or yeast.  Wet prep shows the presence of yeast and few WBCs.  No trichomoniasis or clue cells noted.  Will discharge patient home with a diagnosis of vaginal yeast infection and treat with Diflucan 200 mg 1 tablet now and repeat in 1 week if symptoms persist.   Final Clinical Impressions(s) / UC Diagnoses   Final diagnoses:  Vaginal yeast infection     Discharge Instructions      Take the Diflucan tablet now and repeat in 1 week if you are still having symptoms.  We will send her urine for culture and if any bacteria grows out we will treat you at that time but I suspect her urinary symptoms are coming from the yeast infection.  Return for reevaluation for any new or worsening symptoms.     ED Prescriptions     Medication Sig Dispense Auth. Provider   fluconazole (DIFLUCAN) 200 MG tablet Take 1 tablet (200 mg total) by mouth daily for 2 doses. 2 tablet Margarette Canada, NP      PDMP not  reviewed this encounter.   Margarette Canada, NP 03/04/21 1212

## 2021-03-06 LAB — URINE CULTURE: Culture: >=100000 — AB

## 2021-03-08 ENCOUNTER — Telehealth (HOSPITAL_COMMUNITY): Payer: Self-pay | Admitting: Emergency Medicine

## 2021-03-08 MED ORDER — NITROFURANTOIN MONOHYD MACRO 100 MG PO CAPS
100.0000 mg | ORAL_CAPSULE | Freq: Two times a day (BID) | ORAL | 0 refills | Status: DC
Start: 2021-03-08 — End: 2021-04-24

## 2021-04-24 ENCOUNTER — Encounter: Payer: Self-pay | Admitting: Emergency Medicine

## 2021-04-24 ENCOUNTER — Ambulatory Visit
Admission: EM | Admit: 2021-04-24 | Discharge: 2021-04-24 | Disposition: A | Discharge status: Home / Self Care | Payer: Self-pay | Attending: Emergency Medicine | Admitting: Emergency Medicine

## 2021-04-24 DIAGNOSIS — N390 Urinary tract infection, site not specified: Secondary | ICD-10-CM

## 2021-04-24 DIAGNOSIS — R319 Hematuria, unspecified: Secondary | ICD-10-CM

## 2021-04-24 LAB — URINALYSIS, COMPLETE (UACMP) WITH MICROSCOPIC
Bilirubin Urine: NEGATIVE
Glucose, UA: NEGATIVE mg/dL
Hgb urine dipstick: NEGATIVE
Ketones, ur: NEGATIVE mg/dL
Leukocytes,Ua: NEGATIVE
Nitrite: POSITIVE — AB
Specific Gravity, Urine: >1.03 — ABNORMAL HIGH (ref 1.005–1.030)
WBC, UA: >50 WBC/hpf (ref 0–5)
pH: 5.5 (ref 5.0–8.0)

## 2021-04-24 MED ORDER — PHENAZOPYRIDINE HCL 200 MG PO TABS
200.0000 mg | ORAL_TABLET | Freq: Three times a day (TID) | ORAL | 0 refills | Status: DC | PRN
Start: 2021-04-24 — End: 2021-11-20

## 2021-04-24 MED ORDER — IBUPROFEN 600 MG PO TABS: 600.0000 mg | ORAL_TABLET | Freq: Four times a day (QID) | ORAL | 0 refills | Status: AC | PRN

## 2021-04-24 MED ORDER — NITROFURANTOIN MONOHYD MACRO 100 MG PO CAPS: 100.0000 mg | ORAL_CAPSULE | Freq: Two times a day (BID) | ORAL | 0 refills | Status: AC

## 2021-04-24 NOTE — ED Provider Notes (Addendum)
HPI  SUBJECTIVE:  Heather Adkins is a 25 y.o. female who presents with 3 days of low midline abdominal pain/pressure, dysuria starting yesterday.  No urinary urgency, frequency, cloudy or odorous urine, hematuria.  No nausea, vomiting, fevers, back or pelvic pain.  No vaginal complaints.  She has not been sexually active in 2 weeks.  She is in a relatively new relationship with a female, who is asymptomatic.  STDs are not a concern today.  No antibiotics in the past month.  She took ibuprofen 200 mg within 6 hours of evaluation with improvement in her symptoms.  No aggravating factors.  She has a past medical history of frequent UTIs, states that she gets at least 4-5/year since childhood, and has never been evaluated by urology.  She has had pyelonephritis once.  No history of nephrolithiasis, STDs, BV, yeast, diabetes.  LMP: 2 weeks ago.  Denies the possibility being pregnant.  PMD: None.  Past Medical History:  Diagnosis Date   Anxiety    Depression     History reviewed. No pertinent surgical history.  History reviewed. No pertinent family history.  Social History   Tobacco Use   Smoking status: Every Day    Types: E-cigarettes   Smokeless tobacco: Never  Substance Use Topics   Alcohol use: Yes   Drug use: Yes    Types: Marijuana    No current facility-administered medications for this encounter.  Current Outpatient Medications:    buPROPion (WELLBUTRIN XL) 150 MG 24 hr tablet, Take by mouth., Disp: , Rfl:    ibuprofen (ADVIL) 600 MG tablet, Take 1 tablet (600 mg total) by mouth every 6 (six) hours as needed., Disp: 30 tablet, Rfl: 0   medroxyPROGESTERone Acetate 150 MG/ML SUSY, , Disp: , Rfl: 3   Multiple Vitamin (MULTIVITAMIN) tablet, Take 1 tablet by mouth daily., Disp: , Rfl:    nitrofurantoin, macrocrystal-monohydrate, (MACROBID) 100 MG capsule, Take 1 capsule (100 mg total) by mouth 2 (two) times daily for 5 days., Disp: 10 capsule, Rfl: 0   phenazopyridine  (PYRIDIUM) 200 MG tablet, Take 1 tablet (200 mg total) by mouth 3 (three) times daily as needed for pain., Disp: 6 tablet, Rfl: 0   venlafaxine XR (EFFEXOR-XR) 150 MG 24 hr capsule, Take by mouth., Disp: , Rfl:    cholecalciferol (VITAMIN D) 1000 units tablet, Take 1,000 Units by mouth daily., Disp: , Rfl:    famotidine (PEPCID) 20 MG tablet, Take 2 tablets (40 mg total) by mouth daily for 5 days., Disp: 10 tablet, Rfl: 0   HYDROCORTISONE-DIPHENHYDRAMINE EX, Apply topically., Disp: , Rfl:    medroxyPROGESTERone (DEPO-PROVERA) 150 MG/ML injection, Inject into the muscle., Disp: , Rfl:    ondansetron (ZOFRAN ODT) 4 MG disintegrating tablet, Take 1 tablet (4 mg total) by mouth every 8 (eight) hours as needed for nausea or vomiting., Disp: 20 tablet, Rfl: 0   promethazine (PHENERGAN) 25 MG suppository, Place 1 suppository (25 mg total) rectally every 8 (eight) hours as needed for refractory nausea / vomiting., Disp: 12 suppository, Rfl: 0   triamcinolone cream (KENALOG) 0.1 %, Apply 1 application topically 2 (two) times daily., Disp: 30 g, Rfl: 0  No Known Allergies   ROS  As noted in HPI.   Physical Exam  BP 131/80 (BP Location: Left Arm)    Pulse (!) 103    Temp 99 F (37.2 C)    Resp 16    Ht 5\' 5"  (1.651 m)    Wt 53.5 kg  LMP 04/05/2021    SpO2 100%    BMI 19.63 kg/m   Constitutional: Well developed, well nourished, no acute distress Eyes:  EOMI, conjunctiva normal bilaterally HENT: Normocephalic, atraumatic,mucus membranes moist Respiratory: Normal inspiratory effort Cardiovascular: Normal rate GI: nondistended soft, no suprapubic, flank tenderness Back: No CVAT skin: No rash, skin intact Musculoskeletal: no deformities Neurologic: Alert & oriented x 3, no focal neuro deficits Psychiatric: Speech and behavior appropriate   ED Course   Medications - No data to display  Orders Placed This Encounter  Procedures   Urine Culture    Standing Status:   Standing    Number of  Occurrences:   1    Order Specific Question:   Indication    Answer:   Dysuria   Urinalysis, Complete w Microscopic Urine, Clean Catch    Standing Status:   Standing    Number of Occurrences:   1   Ambulatory referral to Urology    Referral Priority:   Routine    Referral Type:   Consultation    Referral Reason:   Specialty Services Required    Requested Specialty:   Urology    Number of Visits Requested:   1    Results for orders placed or performed during the hospital encounter of 04/24/21 (from the past 24 hour(s))  Urinalysis, Complete w Microscopic Urine, Clean Catch     Status: Abnormal   Collection Time: 04/24/21 10:16 AM  Result Value Ref Range   Color, Urine YELLOW YELLOW   APPearance HAZY (A) CLEAR   Specific Gravity, Urine >1.030 (H) 1.005 - 1.030   pH 5.5 5.0 - 8.0   Glucose, UA NEGATIVE NEGATIVE mg/dL   Hgb urine dipstick NEGATIVE NEGATIVE   Bilirubin Urine NEGATIVE NEGATIVE   Ketones, ur NEGATIVE NEGATIVE mg/dL   Protein, ur TRACE (A) NEGATIVE mg/dL   Nitrite POSITIVE (A) NEGATIVE   Leukocytes,Ua NEGATIVE NEGATIVE   Squamous Epithelial / LPF 6-10 0 - 5   WBC, UA >50 0 - 5 WBC/hpf   RBC / HPF 0-5 0 - 5 RBC/hpf   Bacteria, UA MANY (A) NONE SEEN   WBC Clumps PRESENT    No results found.  ED Clinical Impression  1. Urinary tract infection with hematuria, site unspecified      ED Assessment/Plan  Presentation consistent with UTI.  Last urine culture grew out E. coli that was pansensitive.  Home with Pyridium, ibuprofen/Tylenol, Macrobid.  Push fluids.  Follow-up with PMD of choice.  Will provide primary care list and order assistance in finding a PMD.  We will also refer to urology as she states that she gets at least 4-5 UTIs a year, has been getting them since childhood and has never had a formal evaluation.  ER return precautions given.  Discussed labs, MDM, treatment plan, and plan for follow-up with patient. Discussed sn/sx that should prompt return to the  ED. patient agrees with plan.   Meds ordered this encounter  Medications   phenazopyridine (PYRIDIUM) 200 MG tablet    Sig: Take 1 tablet (200 mg total) by mouth 3 (three) times daily as needed for pain.    Dispense:  6 tablet    Refill:  0   nitrofurantoin, macrocrystal-monohydrate, (MACROBID) 100 MG capsule    Sig: Take 1 capsule (100 mg total) by mouth 2 (two) times daily for 5 days.    Dispense:  10 capsule    Refill:  0   ibuprofen (ADVIL) 600 MG tablet  Sig: Take 1 tablet (600 mg total) by mouth every 6 (six) hours as needed.    Dispense:  30 tablet    Refill:  0      *This clinic note was created using [employer]. Therefore, there may be occasional mistakes despite careful proofreading.  ?    Melynda Ripple, MD 04/24/21 1142    Melynda Ripple, MD 04/25/21 [phone]

## 2021-04-24 NOTE — ED Triage Notes (Signed)
Pt c/o of lower abdominal pain x 3 days and painful urination x 2 days.

## 2021-04-24 NOTE — Discharge Instructions (Addendum)
Finish the [employer], if you feel better.  Pyridium will help with the symptoms.  Continue pushing fluids.  May take 600 mg of ibuprofen with 1000 mg of Tylenol 3-4 times a day as needed for pain.  I have placed a referral to [facility], however, give them a call to establish care .  Go to the ER for the signs and symptoms we discussed  Here is a list of primary care providers who are taking new patients:  Dr. Elizabeth Sauer [address] [place] [address] [phone]  [facility] [address] [place], [place] [address] [phone]  [facility] [place] [address]  [place] [address]  [phone]  [facility] [address] [place], [place] [address] [phone]  [facility] [address] [place]  [phone] [place], [place] [address]  Here are clinics/ other resources who will see you if you do not have insurance. Some have certain criteria that you must meet. Call them and find out what they are:  [facility]: [address], [place], [place] [address] Phone: [phone] Hours: First and Third Saturdays of each Month, 9 a.m. - 1 p.m.  [facility]: [address] [place], [place] [address] Phone: [phone] Hours: Tuesday, 4 p.m. - 8 p.m. Thursday, 1 p.m. - 8 p.m. Wednesday, 9 a.m. - [facility] [address] [place], [place] [address] Phone: [phone] Pharmacy Phone Number: [phone] Dental Phone Number: [phone] [facility] Insurance Help: [phone]  Dental Hours: Monday - Thursday, 8 a.m. - 6 p.m.  [facility] [address] [place], [place] [address] Phone: [phone] Pharmacy Phone Number: [phone] [facility] Insurance Help: [phone]  [facility] [address] [place] [place], [place] [address] Phone: [phone] Pharmacy Phone Number: [phone] [facility] Insurance Help:  [phone]  [facility] [address] [place], [place] [address] Phone: [phone] [facility] Insurance Help: [phone]   [facility] [address] [place], [place] [address] Phone: [phone]  Go to www.goodrx.com  or www.costplusdrugs.com to look up your medications. This will give you a list of where you can find your prescriptions at the most affordable prices. Or ask the pharmacist what the cash price is, or if they have any other discount programs available to help make your medication more affordable. This can be less expensive than what you would pay with insurance.

## 2021-04-27 LAB — URINE CULTURE: Culture: >=100000 — AB

## 2021-04-28 ENCOUNTER — Telehealth (HOSPITAL_COMMUNITY): Payer: Self-pay

## 2021-04-28 MED ORDER — SULFAMETHOXAZOLE-TRIMETHOPRIM 800-160 MG PO TABS: 1.0000 | ORAL_TABLET | Freq: Two times a day (BID) | ORAL | 0 refills | Status: AC

## 2021-05-04 ENCOUNTER — Encounter: Payer: Self-pay | Admitting: Urology

## 2021-05-04 ENCOUNTER — Other Ambulatory Visit: Payer: Self-pay

## 2021-05-04 ENCOUNTER — Ambulatory Visit (INDEPENDENT_AMBULATORY_CARE_PROVIDER_SITE_OTHER): Payer: Self-pay | Admitting: Urology

## 2021-05-04 ENCOUNTER — Other Ambulatory Visit: Payer: Self-pay | Admitting: *Deleted

## 2021-05-04 ENCOUNTER — Other Ambulatory Visit
Admission: RE | Admit: 2021-05-04 | Discharge: 2021-05-04 | Disposition: A | Discharge status: Home / Self Care | Payer: Self-pay | Attending: Urology | Admitting: Urology

## 2021-05-04 VITALS — BP 131/86 | HR 96 | Ht 65.0 in | Wt 122.0 lb

## 2021-05-04 DIAGNOSIS — N390 Urinary tract infection, site not specified: Secondary | ICD-10-CM

## 2021-05-04 DIAGNOSIS — N3000 Acute cystitis without hematuria: Secondary | ICD-10-CM | POA: Insufficient documentation

## 2021-05-04 LAB — URINALYSIS, COMPLETE (UACMP) WITH MICROSCOPIC
Bilirubin Urine: NEGATIVE
Glucose, UA: NEGATIVE mg/dL
Hgb urine dipstick: NEGATIVE
Ketones, ur: NEGATIVE mg/dL
Leukocytes,Ua: NEGATIVE
Nitrite: NEGATIVE
Protein, ur: NEGATIVE mg/dL
Specific Gravity, Urine: 1.025 (ref 1.005–1.030)
pH: 5.5 (ref 5.0–8.0)

## 2021-05-04 MED ORDER — TRIMETHOPRIM 100 MG PO TABS: 100.0000 mg | ORAL_TABLET | Freq: Every day | ORAL | 0 refills | Status: DC

## 2021-05-04 MED ORDER — SULFAMETHOXAZOLE-TRIMETHOPRIM 800-160 MG PO TABS: 1.0000 | ORAL_TABLET | Freq: Two times a day (BID) | ORAL | 0 refills | Status: AC

## 2021-05-04 MED ORDER — TRIMETHOPRIM 100 MG PO TABS: 100.0000 mg | ORAL_TABLET | ORAL | 2 refills | Status: DC | PRN

## 2021-05-04 NOTE — Patient Instructions (Signed)
Take cranberry tablets daily to help prevent infections.  Take the trimethoprim low-dose antibiotic for 90 days, then transition to just taking the trimethoprim around the time of sexual activity to prevent infections

## 2021-05-04 NOTE — Progress Notes (Signed)
° °  05/04/21 4:14 PM   Heather Adkins Shelly Rubenstein 10-18-1995 [id number]  CC: Recurrent UTI  HPI: Healthy 26 year old female who reports recurrent UTIs 4-5 times per year since around age 18.  She was recently treated for an E. coli UTI in November, and an Enterobacter UTI in December 2022.  For her most recent UTI she was originally on nitrofurantoin which was only intermediate sensitivity, then was transition to 3 days of Bactrim.  She has had some persistent UTI symptoms of dysuria.  Urinalysis today is pending.  Her typical UTI symptoms are dysuria.  She reports that she does have UTIs associated with sexual activity.  CT in June 2021 showed no hydronephrosis or stones.  She denies possibility of STD.   PMH: Past Medical History:  Diagnosis Date   Anxiety    Depression     Family History: No family history on file.  Social History:  reports that she has been smoking e-cigarettes. She has never used smokeless tobacco. She reports current alcohol use. She reports current drug use. Drug: Marijuana.  Physical Exam: BP 131/86    Pulse 96    Ht 5\' 5"  (1.651 m)    Wt 122 lb (55.3 kg)    LMP 04/05/2021    BMI 20.30 kg/m    Constitutional:  Alert and oriented, No acute distress. Cardiovascular: No clubbing, cyanosis, or edema. Respiratory: Normal respiratory effort, no increased work of breathing. GI: Abdomen is soft, nontender, nondistended, no abdominal masses  Laboratory Data: Reviewed  Pertinent Imaging: I have personally viewed and interpreted the CT from 2021 showing no stones or hydronephrosis.  Assessment & Plan:    We discussed the evaluation and treatment of patients with recurrent UTIs at length.  We specifically discussed the differences between asymptomatic bacteriuria and true urinary tract infection.  We discussed the AUA definition of recurrent UTI of at least 2 culture proven symptomatic acute cystitis episodes in a 35-month period, or 3 within a 1 year period.  We  discussed the importance of culture directed antibiotic treatment, and antibiotic stewardship.  First-line therapy includes nitrofurantoin(5 days), Bactrim(3 days), or fosfomycin(3 g single dose).  Possible etiologies of recurrent infection include periurethral tissue atrophy in postmenopausal woman, constipation, sexual activity, incomplete emptying, anatomic abnormalities, and even genetic predisposition.  Finally, we discussed the role of perineal hygiene, timed voiding, adequate hydration, topical vaginal estrogen, cranberry prophylaxis, and low-dose antibiotic prophylaxis.  -Bactrim DS twice daily x3 days for persistent UTI symptoms -Urine sent for culture and atypicals -Consider STD testing if persistent symptoms -Trimethoprim prophylaxis x90 days, then transition to postcoital trimethoprim   8-month, MD 05/04/2021  [facility] [address] [place], [place] [phone]

## 2021-05-05 LAB — URINE CULTURE: Culture: NO GROWTH

## 2021-05-07 ENCOUNTER — Telehealth: Payer: Self-pay | Admitting: *Deleted

## 2021-05-07 LAB — MISC LABCORP TEST (SEND OUT): Labcorp test code: 86884

## 2021-05-07 MED ORDER — DOXYCYCLINE HYCLATE 100 MG PO CAPS: 100.0000 mg | ORAL_CAPSULE | Freq: Two times a day (BID) | ORAL | 0 refills | Status: DC

## 2021-05-07 NOTE — Telephone Encounter (Addendum)
Patient informed, RX sent to [facility] [place] as requested. Voiced understanding  ----- Message from Billey Co, MD sent at 05/07/2021  3:45 PM EST ----- Urine culture was positive for atypical bacteria, please send in doxycycline 100 mg twice daily x7 days.  This should significantly improve her persistent UTI type symptoms.  Keep follow-up as scheduled  Nickolas Madrid, MD 05/07/2021

## 2021-05-11 ENCOUNTER — Telehealth: Payer: Self-pay | Admitting: Urology

## 2021-05-11 NOTE — Telephone Encounter (Signed)
Oki- both her atypicals were positive, I think Beca changed her to doxycycline Friday PM. Continue the doxy and should improve, pyridium as needed ok   Called pt informed her of the information above, pt voiced understanding.

## 2021-05-11 NOTE — Telephone Encounter (Signed)
Patient called and  she said that the ABX she was given last Friday is not helping her symptoms. its only been 4 days should she keep taking it and see? I advised her that someone would get back with her. Heather Adkins

## 2021-05-25 ENCOUNTER — Other Ambulatory Visit
Admission: RE | Admit: 2021-05-25 | Discharge: 2021-05-25 | Disposition: A | Discharge status: Home / Self Care | Payer: BC Managed Care – PPO | Attending: Urology | Admitting: Urology

## 2021-05-25 ENCOUNTER — Telehealth: Payer: Self-pay | Admitting: Urology

## 2021-05-25 ENCOUNTER — Other Ambulatory Visit: Payer: Self-pay

## 2021-05-25 DIAGNOSIS — Z113 Encounter for screening for infections with a predominantly sexual mode of transmission: Secondary | ICD-10-CM | POA: Insufficient documentation

## 2021-05-25 DIAGNOSIS — N390 Urinary tract infection, site not specified: Secondary | ICD-10-CM | POA: Insufficient documentation

## 2021-05-25 DIAGNOSIS — R109 Unspecified abdominal pain: Secondary | ICD-10-CM

## 2021-05-25 LAB — URINALYSIS, COMPLETE (UACMP) WITH MICROSCOPIC
Bilirubin Urine: NEGATIVE
Glucose, UA: NEGATIVE mg/dL
Hgb urine dipstick: NEGATIVE
Ketones, ur: NEGATIVE mg/dL
Leukocytes,Ua: NEGATIVE
Nitrite: NEGATIVE
Protein, ur: NEGATIVE mg/dL
Specific Gravity, Urine: 1.01 (ref 1.005–1.030)
pH: 7 (ref 5.0–8.0)

## 2021-05-25 NOTE — Telephone Encounter (Signed)
Called pt informed her of the information below per Dr. Richardo Hanks. Pt gave verbal understanding. Orders placed.

## 2021-05-25 NOTE — Telephone Encounter (Signed)
Recommend lab visit for repeat urinalysis and culture, atypicals, as well as STD testing(G/C) as we discussed previously.  Would also recommend a KUB to evaluate for any kidney stones.  Legrand Rams, MD 05/25/2021

## 2021-05-25 NOTE — Telephone Encounter (Signed)
Please advise 

## 2021-05-25 NOTE — Addendum Note (Signed)
Addended by: Donalee Citrin on: 05/25/2021 02:15 PM   Modules accepted: Orders

## 2021-05-25 NOTE — Telephone Encounter (Signed)
Patient states that she completed her antibiotic that was prescribed at her last visit.  She stated that she is continuing to have frequent urination and back pain on the left side.  She stated that she feels that she never got better.  Patient is requesting another antibiotic if possible a stronger one.

## 2021-05-27 ENCOUNTER — Telehealth: Payer: Self-pay | Admitting: Urology

## 2021-05-27 LAB — URINE CULTURE: Culture: <10000 — AB

## 2021-05-27 NOTE — Telephone Encounter (Signed)
Pt requested an appt with Dr. Richardo Hanks to discuss a plan of care for a uti she has had for a month. Says she would like a call back from someone today if possible. She gave a urine sample on 1/31.

## 2021-05-27 NOTE — Telephone Encounter (Signed)
Called pt informed her that her urine cx is negative therefore she does not have a UTI. Advised pt that STD testing is still pending and we will call when results are finalized. Pt voiced understanding.

## 2021-05-28 LAB — GC/CHLAMYDIA PROBE AMP
Chlamydia trachomatis, NAA: NEGATIVE
Neisseria Gonorrhoeae by PCR: NEGATIVE

## 2021-06-03 LAB — MISC LABCORP TEST (SEND OUT): Labcorp test code: 86884

## 2021-08-06 ENCOUNTER — Ambulatory Visit: Payer: Self-pay | Admitting: Family Medicine

## 2021-11-02 ENCOUNTER — Ambulatory Visit: Payer: Self-pay | Admitting: Urology

## 2021-11-20 ENCOUNTER — Ambulatory Visit
Admission: EM | Admit: 2021-11-20 | Discharge: 2021-11-20 | Disposition: A | Discharge status: Home / Self Care | Payer: BC Managed Care – PPO | Attending: Emergency Medicine | Admitting: Emergency Medicine

## 2021-11-20 DIAGNOSIS — N390 Urinary tract infection, site not specified: Secondary | ICD-10-CM | POA: Insufficient documentation

## 2021-11-20 LAB — URINALYSIS, ROUTINE W REFLEX MICROSCOPIC
Bilirubin Urine: NEGATIVE
Glucose, UA: NEGATIVE mg/dL
Ketones, ur: NEGATIVE mg/dL
Nitrite: POSITIVE — AB
Protein, ur: NEGATIVE mg/dL
Specific Gravity, Urine: 1.025 (ref 1.005–1.030)
pH: 5.5 (ref 5.0–8.0)

## 2021-11-20 LAB — URINALYSIS, MICROSCOPIC (REFLEX): WBC, UA: >50 WBC/hpf (ref 0–5)

## 2021-11-20 MED ORDER — PHENAZOPYRIDINE HCL 200 MG PO TABS
200.0000 mg | ORAL_TABLET | Freq: Three times a day (TID) | ORAL | 0 refills | Status: AC
Start: 2021-11-20 — End: ?

## 2021-11-20 MED ORDER — CEPHALEXIN 500 MG PO CAPS: 500.0000 mg | ORAL_CAPSULE | Freq: Two times a day (BID) | ORAL | 0 refills | Status: AC

## 2021-11-20 NOTE — ED Provider Notes (Signed)
[facility]    CSN: [id number] Arrival date & time: 11/20/21  1559      History   Chief Complaint Chief Complaint  Patient presents with   Urinary Frequency         HPI Heather Adkins is a 26 y.o. female.   HPI  31 old female here for evaluation of urinary complaints.  Patient reports that for last 2 days she has been experiencing burning at the end of urination and urinary frequency.  She denies urgency.  She states she has seen blood in her urine and she is also complaining of some suprapubic abdominal pain.  She denies any cloudiness to her urine, low back pain, nausea, vomiting, vaginal discharge, or vaginal itching.  Past Medical History:  Diagnosis Date   Anxiety    Depression     There are no problems to display for this patient.   History reviewed. No pertinent surgical history.  OB History   No obstetric history on file.      Home Medications    Prior to Admission medications   Medication Sig Start Date End Date Taking? Authorizing Provider  cephALEXin (KEFLEX) 500 MG capsule Take 1 capsule (500 mg total) by mouth 2 (two) times daily for 5 days. 11/20/21 11/25/21 Yes Becky Augusta, NP  phenazopyridine (PYRIDIUM) 200 MG tablet Take 1 tablet (200 mg total) by mouth 3 (three) times daily. 11/20/21  Yes Becky Augusta, NP  buPROPion (WELLBUTRIN XL) 150 MG 24 hr tablet Take by mouth.    [provider]  cholecalciferol (VITAMIN D) 1000 units tablet Take 1,000 Units by mouth daily.    [provider]  famotidine (PEPCID) 20 MG tablet Take 2 tablets (40 mg total) by mouth daily for 5 days. 10/22/19 10/27/19  Shaune Pollack, MD  HYDROCORTISONE-DIPHENHYDRAMINE EX Apply topically.    [provider]  ibuprofen (ADVIL) 600 MG tablet Take 1 tablet (600 mg total) by mouth every 6 (six) hours as needed. 04/24/21   Domenick Gong, MD  medroxyPROGESTERone (DEPO-PROVERA) 150 MG/ML injection Inject into the muscle. 12/23/16    [provider]  medroxyPROGESTERone Acetate 150 MG/ML SUSY  09/12/17   [provider]  Multiple Vitamin (MULTIVITAMIN) tablet Take 1 tablet by mouth daily.    [provider]  ondansetron (ZOFRAN ODT) 4 MG disintegrating tablet Take 1 tablet (4 mg total) by mouth every 8 (eight) hours as needed for nausea or vomiting. 10/22/19   Shaune Pollack, MD  promethazine (PHENERGAN) 25 MG suppository Place 1 suppository (25 mg total) rectally every 8 (eight) hours as needed for refractory nausea / vomiting. 10/22/19 10/21/20  Shaune Pollack, MD  sulfamethoxazole-trimethoprim (BACTRIM DS) 800-160 MG tablet Take 1 tablet by mouth 2 (two) times daily. 05/04/21   Sondra Come, MD  triamcinolone cream (KENALOG) 0.1 % Apply 1 application topically 2 (two) times daily. 12/03/17   Zachery Dauer, NP  trimethoprim (TRIMPEX) 100 MG tablet Take 1 tablet (100 mg total) by mouth as needed (Take around the time of sexual activity to prevent UTIs). 05/04/21   Sondra Come, MD  venlafaxine XR (EFFEXOR-XR) 150 MG 24 hr capsule Take by mouth. 12/22/16   [provider]    Family History History reviewed. No pertinent family history.  Social History Social History   Tobacco Use   Smoking status: Every Day    Types: E-cigarettes   Smokeless tobacco: Never  Substance Use Topics   Alcohol use: Yes   Drug use: Yes  Types: Marijuana     Allergies   Patient has no known allergies.   Review of Systems Review of Systems  Constitutional:  Negative for fever.  Gastrointestinal:  Positive for abdominal pain. Negative for nausea and vomiting.  Genitourinary:  Positive for dysuria, frequency and hematuria. Negative for urgency, vaginal discharge and vaginal pain.  Musculoskeletal:  Negative for back pain.  Hematological: Negative.   Psychiatric/Behavioral: Negative.       Physical Exam Triage Vital Signs ED Triage Vitals [11/20/21 1610]  Enc Vitals Group     BP 125/79      Pulse Rate 91     Resp 18     Temp 98.9 F (37.2 C)     Temp Source Oral     SpO2 100 %     Weight      Height      Head Circumference      Peak Flow      Pain Score 0     Pain Loc      Pain Edu?      Excl. in GC?    No data found.  Updated Vital Signs BP 125/79 (BP Location: Left Arm)   Pulse 91   Temp 98.9 F (37.2 C) (Oral)   Resp 18   SpO2 100%   Visual Acuity Right Eye Distance:   Left Eye Distance:   Bilateral Distance:    Right Eye Near:   Left Eye Near:    Bilateral Near:     Physical Exam Vitals and nursing note reviewed.  Constitutional:      Appearance: Normal appearance. She is not ill-appearing.  HENT:     Head: Normocephalic and atraumatic.  Cardiovascular:     Rate and Rhythm: Normal rate and regular rhythm.     Pulses: Normal pulses.     Heart sounds: Normal heart sounds. No murmur heard.    No friction rub. No gallop.  Pulmonary:     Effort: Pulmonary effort is normal.     Breath sounds: Normal breath sounds. No wheezing, rhonchi or rales.  Abdominal:     Tenderness: There is abdominal tenderness. There is no right CVA tenderness, left CVA tenderness, guarding or rebound.  Skin:    General: Skin is warm and dry.     Capillary Refill: Capillary refill takes less than 2 seconds.  Neurological:     General: No focal deficit present.     Mental Status: She is alert and oriented to person, place, and time.  Psychiatric:        Mood and Affect: Mood normal.        Behavior: Behavior normal.        Thought Content: Thought content normal.        Judgment: Judgment normal.      UC Treatments / Results  Labs (all labs ordered are listed, but only abnormal results are displayed) Labs Reviewed  URINALYSIS, ROUTINE W REFLEX MICROSCOPIC - Abnormal; Notable for the following components:      Result Value   APPearance HAZY (*)    Hgb urine dipstick MODERATE (*)    Nitrite POSITIVE (*)    Leukocytes,Ua MODERATE (*)    All other components  within normal limits  URINALYSIS, MICROSCOPIC (REFLEX) - Abnormal; Notable for the following components:   Bacteria, UA MANY (*)    All other components within normal limits  URINE CULTURE    EKG   Radiology No results found.  Procedures Procedures (including critical  care time)  Medications Ordered in UC Medications - No data to display  Initial Impression / Assessment and Plan / UC Course  I have reviewed the triage vital signs and the nursing notes.  Pertinent labs & imaging results that were available during my care of the patient were reviewed by me and considered in my medical decision making (see chart for details).  Is a nontoxic-appearing 53 old female here for evaluation of urinary complaints as outlined in HPI above.  Physical exam reveals benign cardiopulmonary exam with S1-S2 heart sounds with regular rate and rhythm and lung sounds that are clear auscultation all fields.  No CVA tenderness on exam.  Abdomen soft, flat, with mild suprapubic tenderness.  No guarding or rebound.  We will send urine for analysis.  Urinalysis shows hazy appearance, moderate hemoglobin, nitrite positive, moderate leukocyte esterase.  No protein.  Reflex micro shows greater than 50 WBCs, many bacteria, and WBC clumps.  I will send urine for culture.  Charge patient home with a diagnosis of urinary tract infection and treat her with Keflex twice daily for 5 days and Pyridium 3 times a day as needed for urinary discomfort.   Final Clinical Impressions(s) / UC Diagnoses   Final diagnoses:  Lower urinary tract infectious disease     Discharge Instructions      Take the Macrobid twice daily for 5 days with food for treatment of urinary tract infection.  Use the Pyridium every 8 hours as needed for urinary discomfort.  This will turn your urine a bright red-orange.  Increase your oral fluid intake so that you increase your urine production and or flushing your urinary system.  Take an  over-the-counter probiotic, such as Culturelle-Align-Activia, 1 hour after each dose of antibiotic to prevent diarrhea or yeast infections from forming.  We will culture urine and change the antibiotics if necessary.  Return for reevaluation, or see your primary care provider, for any new or worsening symptoms.      ED Prescriptions     Medication Sig Dispense Auth. Provider   cephALEXin (KEFLEX) 500 MG capsule Take 1 capsule (500 mg total) by mouth 2 (two) times daily for 5 days. 10 capsule Becky Augusta, NP   phenazopyridine (PYRIDIUM) 200 MG tablet Take 1 tablet (200 mg total) by mouth 3 (three) times daily. 6 tablet Becky Augusta, NP      PDMP not reviewed this encounter.   Becky Augusta, NP 11/20/21 1622

## 2021-11-20 NOTE — ED Triage Notes (Signed)
Pt present urinary frequency with burning sensation after urinating. Symptoms started two days ago.

## 2021-11-20 NOTE — Discharge Instructions (Addendum)
Take the Keflex twice daily for 5 days with food for treatment of urinary tract infection.  Use the Pyridium every 8 hours as needed for urinary discomfort.  This will turn your urine a bright red-orange.  Increase your oral fluid intake so that you increase your urine production and or flushing your urinary system.  Take an over-the-counter probiotic, such as Culturelle-Align-Activia, 1 hour after each dose of antibiotic to prevent diarrhea or yeast infections from forming.  We will culture urine and change the antibiotics if necessary.  Return for reevaluation, or see your primary care provider, for any new or worsening symptoms.  

## 2021-11-22 LAB — URINE CULTURE: Culture: >=100000 — AB

## 2021-11-27 ENCOUNTER — Ambulatory Visit
Admission: EM | Admit: 2021-11-27 | Discharge: 2021-11-27 | Disposition: A | Discharge status: Home / Self Care | Payer: Self-pay | Attending: Emergency Medicine | Admitting: Emergency Medicine

## 2021-11-27 DIAGNOSIS — R300 Dysuria: Secondary | ICD-10-CM | POA: Insufficient documentation

## 2021-11-27 LAB — URINALYSIS, ROUTINE W REFLEX MICROSCOPIC
Bilirubin Urine: NEGATIVE
Glucose, UA: NEGATIVE mg/dL
Hgb urine dipstick: NEGATIVE
Ketones, ur: NEGATIVE mg/dL
Leukocytes,Ua: NEGATIVE
Nitrite: NEGATIVE
Protein, ur: NEGATIVE mg/dL
Specific Gravity, Urine: 1.015 (ref 1.005–1.030)
pH: 7 (ref 5.0–8.0)

## 2021-11-27 LAB — WET PREP, GENITAL
Clue Cells Wet Prep HPF POC: NONE SEEN
Sperm: NONE SEEN
Trich, Wet Prep: NONE SEEN
WBC, Wet Prep HPF POC: <10 — AB (ref <10)
Yeast Wet Prep HPF POC: NONE SEEN

## 2021-11-27 MED ORDER — CIPROFLOXACIN HCL 500 MG PO TABS: 500.0000 mg | ORAL_TABLET | Freq: Two times a day (BID) | ORAL | 0 refills | Status: AC

## 2021-11-27 MED ORDER — TRIMETHOPRIM 100 MG PO TABS: 100.0000 mg | ORAL_TABLET | ORAL | 2 refills | Status: AC | PRN

## 2021-11-27 NOTE — ED Triage Notes (Signed)
Pt c/o continued UTI since last week. Pt states that she is still having urinary frequency and burning.

## 2021-11-27 NOTE — ED Provider Notes (Signed)
[facility]    CSN: [id number] Arrival date & time: 11/27/21  1508      History   Chief Complaint No chief complaint on file.   HPI Heather Adkins is a 26 y.o. female.   Patient presents with dysuria beginning 1 day ago after completion of Keflex for urinary infection.  Endorses history of reoccurring infections requiring prophylactic antibiotic treatment.  Denies urinary frequency, urgency, hematuria, lower abdominal pain or pressure, flank pain, fever, chills, vaginal discharge, itching or odor.  Has not attempted treatment of symptoms.    Past Medical History:  Diagnosis Date   Anxiety    Depression     There are no problems to display for this patient.   History reviewed. No pertinent surgical history.  OB History   No obstetric history on file.      Home Medications    Prior to Admission medications   Medication Sig Start Date End Date Taking? Authorizing Provider  buPROPion (WELLBUTRIN XL) 150 MG 24 hr tablet Take by mouth.   Yes [provider]  cholecalciferol (VITAMIN D) 1000 units tablet Take 1,000 Units by mouth daily.   Yes [provider]  HYDROCORTISONE-DIPHENHYDRAMINE EX Apply topically.   Yes [provider]  ibuprofen (ADVIL) 600 MG tablet Take 1 tablet (600 mg total) by mouth every 6 (six) hours as needed. 04/24/21  Yes Domenick Gong, MD  medroxyPROGESTERone (DEPO-PROVERA) 150 MG/ML injection Inject into the muscle. 12/23/16  Yes [provider]  medroxyPROGESTERone Acetate 150 MG/ML SUSY  09/12/17  Yes [provider]  Multiple Vitamin (MULTIVITAMIN) tablet Take 1 tablet by mouth daily.   Yes [provider]  ondansetron (ZOFRAN ODT) 4 MG disintegrating tablet Take 1 tablet (4 mg total) by mouth every 8 (eight) hours as needed for nausea or vomiting. 10/22/19  Yes Shaune Pollack, MD  phenazopyridine (PYRIDIUM) 200 MG tablet Take 1 tablet (200 mg total) by mouth 3 (three) times  daily. 11/20/21  Yes Becky Augusta, NP  sulfamethoxazole-trimethoprim (BACTRIM DS) 800-160 MG tablet Take 1 tablet by mouth 2 (two) times daily. 05/04/21  Yes Sondra Come, MD  triamcinolone cream (KENALOG) 0.1 % Apply 1 application topically 2 (two) times daily. 12/03/17  Yes Zachery Dauer, NP  trimethoprim (TRIMPEX) 100 MG tablet Take 1 tablet (100 mg total) by mouth as needed (Take around the time of sexual activity to prevent UTIs). 05/04/21  Yes Sondra Come, MD  venlafaxine XR (EFFEXOR-XR) 150 MG 24 hr capsule Take by mouth. 12/22/16  Yes [provider]  famotidine (PEPCID) 20 MG tablet Take 2 tablets (40 mg total) by mouth daily for 5 days. 10/22/19 10/27/19  Shaune Pollack, MD  promethazine (PHENERGAN) 25 MG suppository Place 1 suppository (25 mg total) rectally every 8 (eight) hours as needed for refractory nausea / vomiting. 10/22/19 10/21/20  Shaune Pollack, MD    Family History History reviewed. No pertinent family history.  Social History Social History   Tobacco Use   Smoking status: Former    Types: E-cigarettes   Smokeless tobacco: Never  Vaping Use   Vaping Use: Never used  Substance Use Topics   Alcohol use: Yes   Drug use: Yes    Types: Marijuana     Allergies   Patient has no known allergies.   Review of Systems Review of Systems  Constitutional: Negative.   Genitourinary:  Positive for dysuria. Negative for decreased urine volume, difficulty urinating, dyspareunia, enuresis, flank pain, frequency, genital sores, hematuria,  menstrual problem, pelvic pain, urgency, vaginal bleeding, vaginal discharge and vaginal pain.  Musculoskeletal: Negative.   Skin: Negative.      Physical Exam Triage Vital Signs ED Triage Vitals  Enc Vitals Group     BP 11/27/21 1521 114/72     Pulse Rate 11/27/21 1521 87     Resp 11/27/21 1521 18     Temp 11/27/21 1521 98.6 F (37 C)     Temp Source 11/27/21 1521 Oral     SpO2 11/27/21 1521 100 %     Weight  11/27/21 1519 125 lb (56.7 kg)     Height 11/27/21 1519 5\' 5"  (1.651 m)     Head Circumference --      Peak Flow --      Pain Score 11/27/21 1519 0     Pain Loc --      Pain Edu? --      Excl. in GC? --    No data found.  Updated Vital Signs BP 114/72 (BP Location: Left Arm)   Pulse 87   Temp 98.6 F (37 C) (Oral)   Resp 18   Ht 5\' 5"  (1.651 m)   Wt 125 lb (56.7 kg)   SpO2 100%   BMI 20.80 kg/m   Visual Acuity Right Eye Distance:   Left Eye Distance:   Bilateral Distance:    Right Eye Near:   Left Eye Near:    Bilateral Near:     Physical Exam Vitals and nursing note reviewed.  Constitutional:      Appearance: Normal appearance. She is normal weight.  Eyes:     Extraocular Movements: Extraocular movements intact.     Conjunctiva/sclera: Conjunctivae normal.  Pulmonary:     Effort: Pulmonary effort is normal.  Abdominal:     General: Abdomen is flat. Bowel sounds are normal.     Palpations: Abdomen is soft.  Musculoskeletal:        General: Normal range of motion.     Cervical back: Normal range of motion.  Neurological:     Mental Status: She is alert and oriented to person, place, and time. Mental status is at baseline.  Psychiatric:        Mood and Affect: Mood normal.        Behavior: Behavior normal.        Thought Content: Thought content normal.        Judgment: Judgment normal.      UC Treatments / Results  Labs (all labs ordered are listed, but only abnormal results are displayed) Labs Reviewed  WET PREP, GENITAL  URINALYSIS, ROUTINE W REFLEX MICROSCOPIC    EKG   Radiology No results found.  Procedures Procedures (including critical care time)  Medications Ordered in UC Medications - No data to display  Initial Impression / Assessment and Plan / UC Course  I have reviewed the triage vital signs and the nursing notes.  Pertinent labs & imaging results that were available during my care of the patient were reviewed by me and  considered in my medical decision making (see chart for details).  Dysuria  Urinalysis and wet prep negative, possibly skewed as she completed antibiotics 1 day ago, will extend antibiotic course with ciprofloxacin as E. coli was identified on culture, discussed with patient, refilled Trimix prophylactically and given walker referral to urology for further evaluation and management as she endorses her PCP has retired, recommended increase fluid intake through use of water and good hygiene measures for  additional supportive care, may follow-up with his urgent care as needed Final Clinical Impressions(s) / UC Diagnoses   Final diagnoses:  None   Discharge Instructions   None    ED Prescriptions   None    PDMP not reviewed this encounter.   Valinda Hoar, [place] 11/27/21 [phone]

## 2021-11-27 NOTE — Discharge Instructions (Addendum)
Urinalysis and wet prep are negative, unsure if testing has been skewed by recent use of antibiotic therefore will extend treatment course to ensure that all E. coli seen on culture has been treated  Take ciprofloxacin every morning and every evening for 3 days, start trimpex after completion   Schedule an appointment with urology for further management of your recurrent infections  Increase fluid intake use of water  As always practice good hygiene measures such as wiping from front to back, urinating after sexual encounters and avoiding scented vaginal products  If symptoms continue to persist or worsen follow-up with urgent care
